# Patient Record
Sex: Male | Born: 2000 | Race: White | Hispanic: No | Marital: Single | Smoking: Never smoker
Health system: Southern US, Community
[De-identification: ages and names within clinical notes are randomized; demographics above are authoritative.]

## PROBLEM LIST (undated history)

## (undated) DIAGNOSIS — S3992XA Unspecified injury of lower back, initial encounter: Secondary | ICD-10-CM

## (undated) DIAGNOSIS — F32A Depression, unspecified: Secondary | ICD-10-CM

---
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
## 2021-09-22 ENCOUNTER — Other Ambulatory Visit (HOSPITAL_COMMUNITY): Payer: Self-pay | Admitting: Pain Medicine

## 2021-09-22 DIAGNOSIS — M546 Pain in thoracic spine: Secondary | ICD-10-CM

## 2021-09-22 DIAGNOSIS — M545 Low back pain, unspecified: Secondary | ICD-10-CM

## 2021-10-07 ENCOUNTER — Encounter (HOSPITAL_COMMUNITY): Payer: Self-pay

## 2021-10-07 ENCOUNTER — Ambulatory Visit (HOSPITAL_COMMUNITY): Payer: Managed Care, Other (non HMO) | Attending: Pain Medicine

## 2021-10-07 ENCOUNTER — Ambulatory Visit (HOSPITAL_COMMUNITY): Admission: RE | Admit: 2021-10-07 | Payer: Managed Care, Other (non HMO) | Source: Ambulatory Visit

## 2021-12-02 ENCOUNTER — Ambulatory Visit (HOSPITAL_COMMUNITY)
Admission: RE | Admit: 2021-12-02 | Discharge: 2021-12-02 | Disposition: A | Discharge status: Home / Self Care | Payer: Managed Care, Other (non HMO) | Source: Ambulatory Visit | Attending: Pain Medicine | Admitting: Pain Medicine

## 2021-12-02 ENCOUNTER — Encounter (HOSPITAL_COMMUNITY): Payer: Self-pay

## 2021-12-02 ENCOUNTER — Other Ambulatory Visit: Payer: Self-pay

## 2021-12-02 ENCOUNTER — Ambulatory Visit (HOSPITAL_COMMUNITY): Admission: RE | Admit: 2021-12-02 | Payer: Managed Care, Other (non HMO) | Source: Ambulatory Visit

## 2021-12-02 DIAGNOSIS — M546 Pain in thoracic spine: Secondary | ICD-10-CM | POA: Insufficient documentation

## 2021-12-02 DIAGNOSIS — M545 Low back pain, unspecified: Secondary | ICD-10-CM | POA: Insufficient documentation

## 2021-12-02 IMAGING — MR MR THORACIC SPINE W/O CM
4 of 11 series · 19 of 48 positions shown · non-contrast
Comparison: Abdominal CT [DATE]

CLINICAL DATA: Constant thoracic pain radiating down the arms and
legs for 4 years.

EXAM:
MRI THORACIC AND LUMBAR SPINE WITHOUT CONTRAST
TECHNIQUE: Multiplanar and multiecho pulse sequences of the thoracic and lumbar
spine were obtained without intravenous contrast.

[Series 3: T2 · sagittal · 3.0mm · 0.66mm/px · 3 of 15 slices shown (1 of 4)]
[im 1/15]
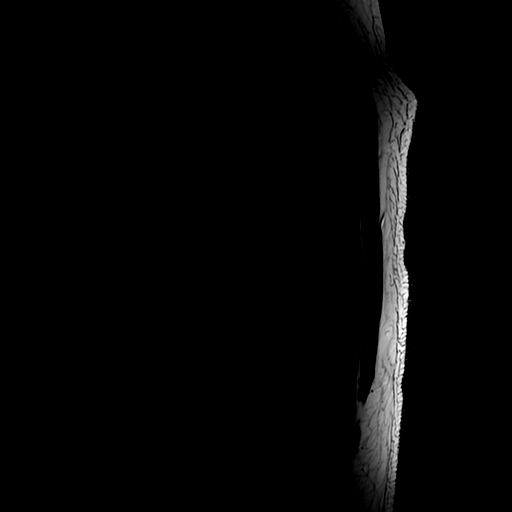
[im 8/15]
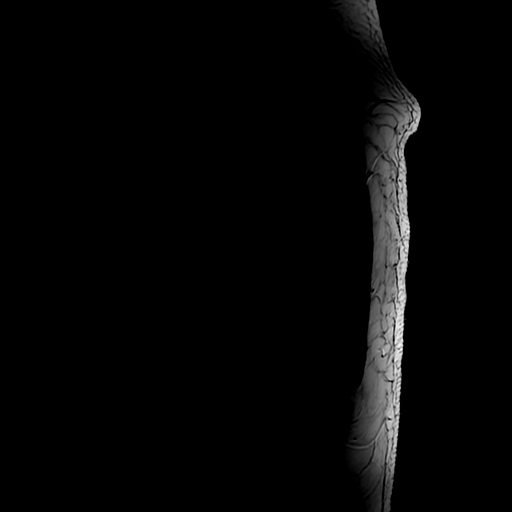
[im 15/15]
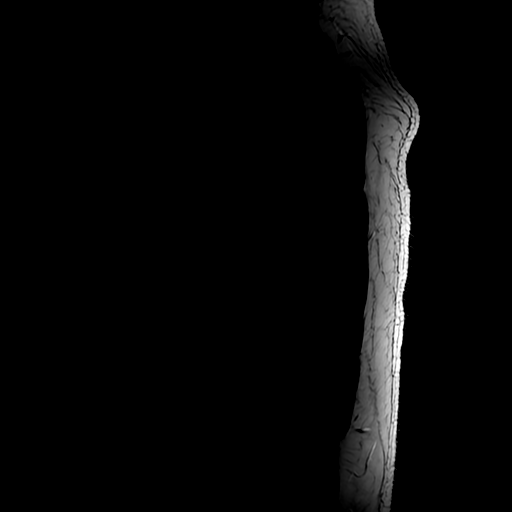

[Series 7: T2 · axial · 4.0mm · 0.39mm/px · z∈[-256,+26]mm · 7 of 39 slices shown (2 of 4)]
[im 1/39]
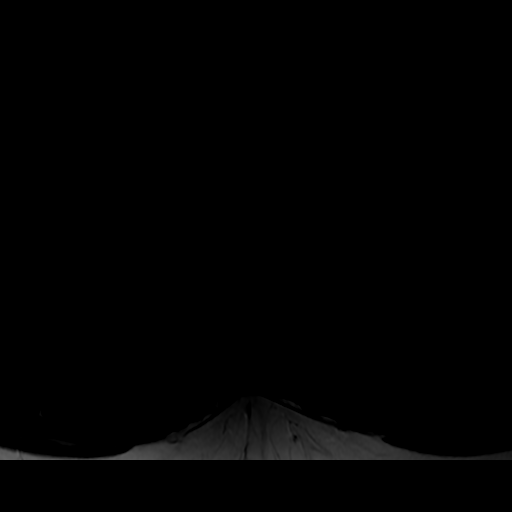
[im 7/39]
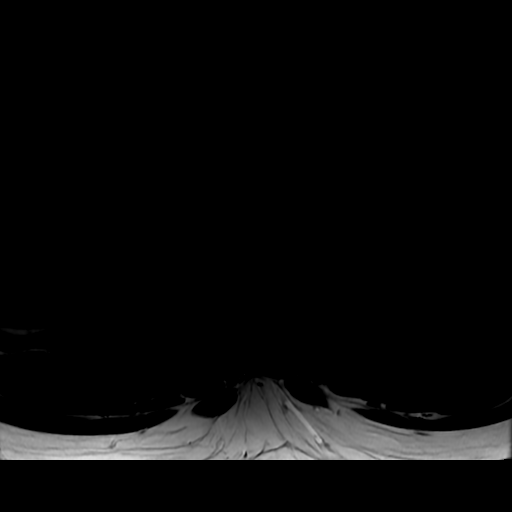
[im 13/39]
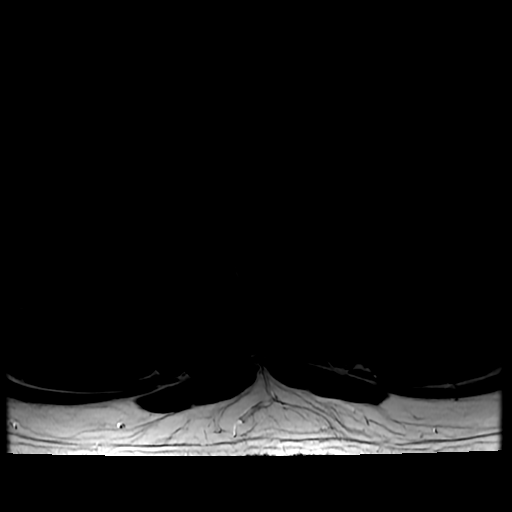
[im 20/39]
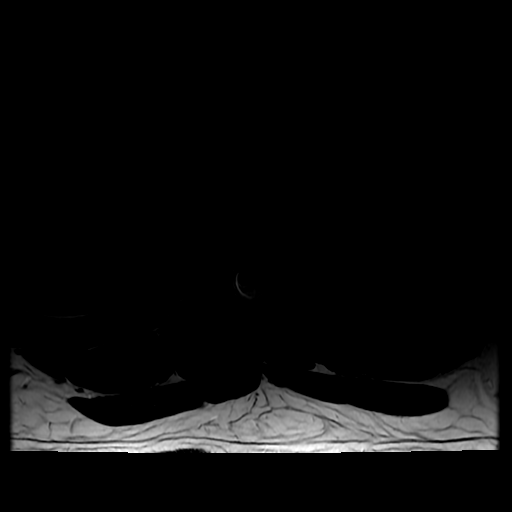
[im 26/39]
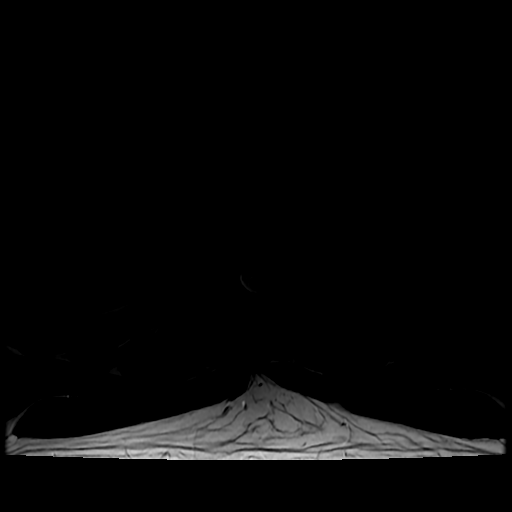
[im 32/39]
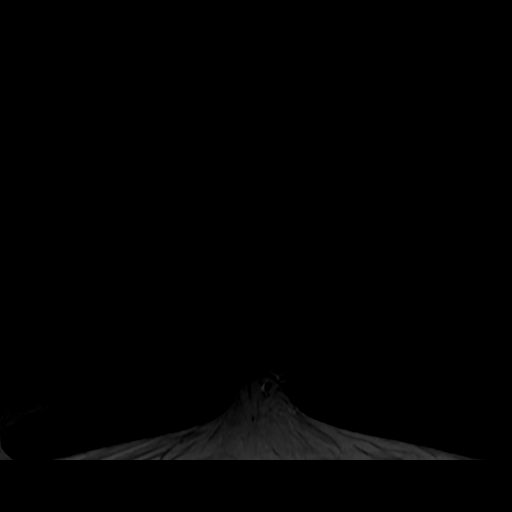
[im 39/39]
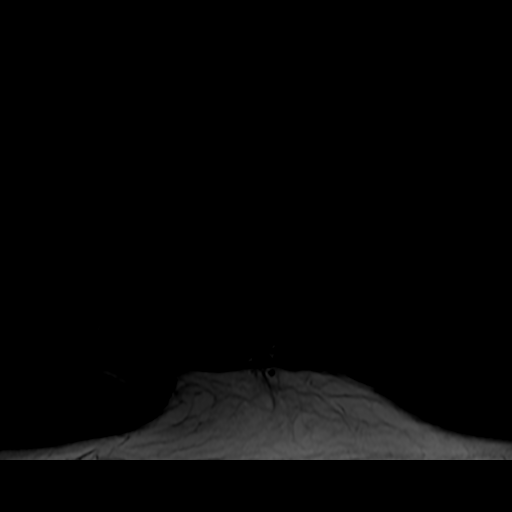

[Series 11: T2 · sagittal · 4.0mm · 0.55mm/px · 3 of 15 slices shown (3 of 4)]
[im 1/15]
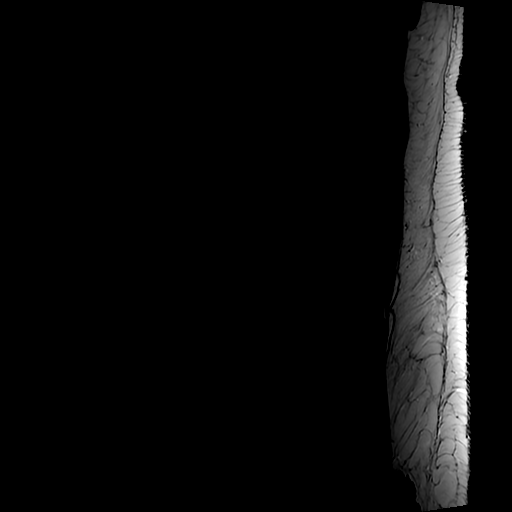
[im 8/15]
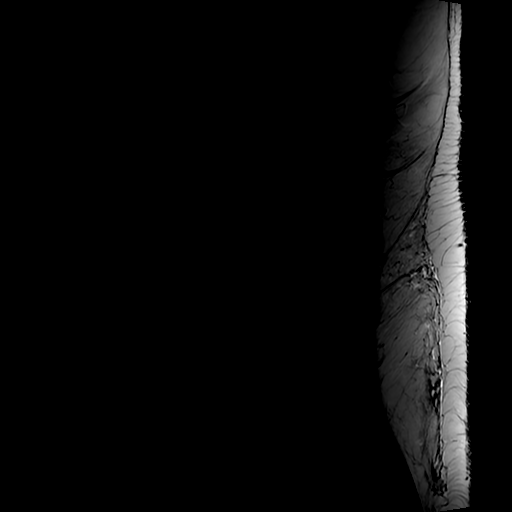
[im 15/15]
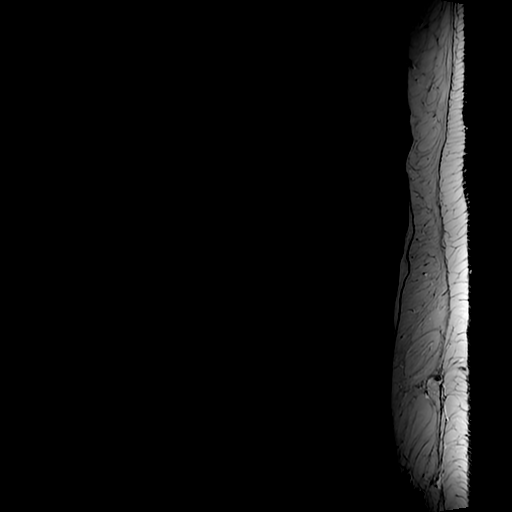

[Series 14: T2 · axial · 4.0mm · 0.39mm/px · z∈[-455,-290]mm · 6 of 36 slices shown (4 of 4)]
[im 1/36]
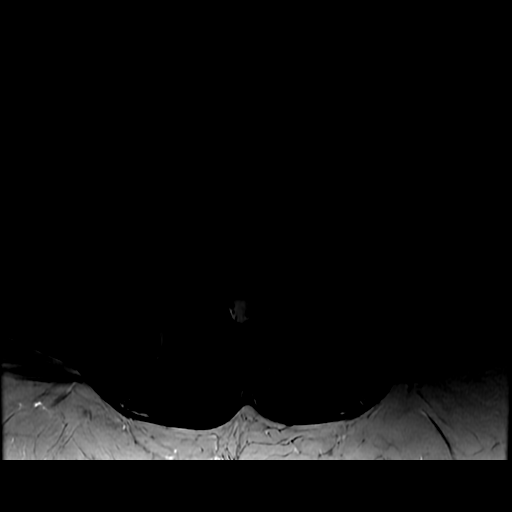
[im 6/36]
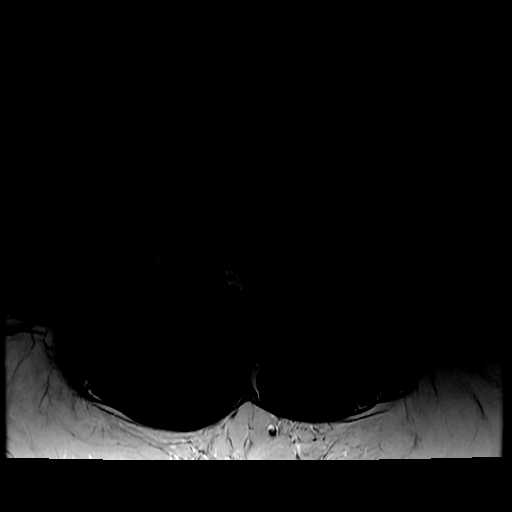
[im 12/36]
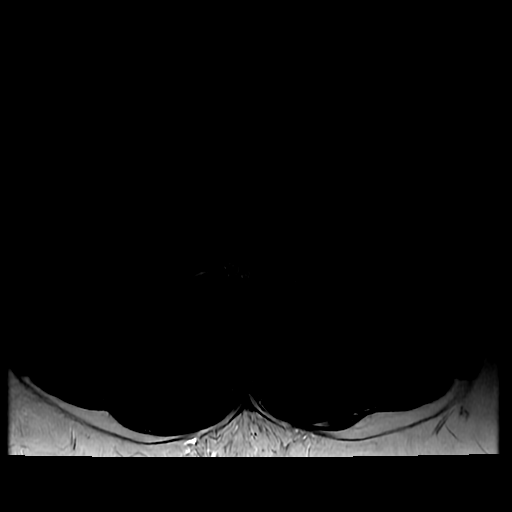
[im 18/36]
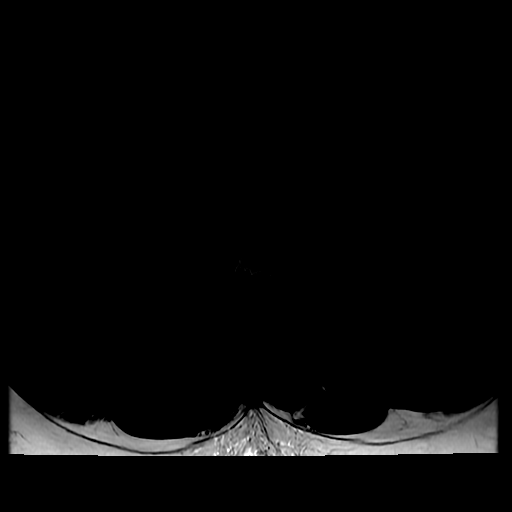
[im 24/36]
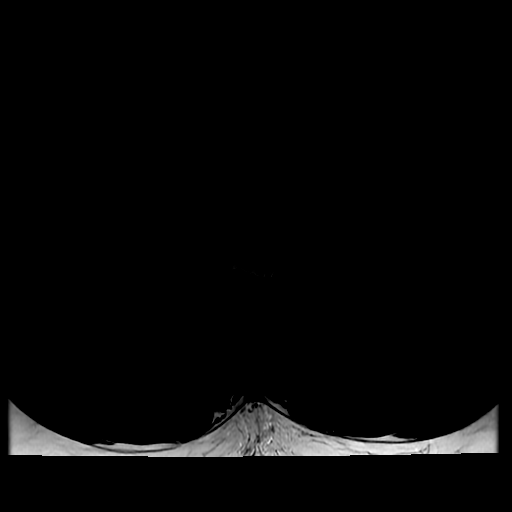
[im 30/36]
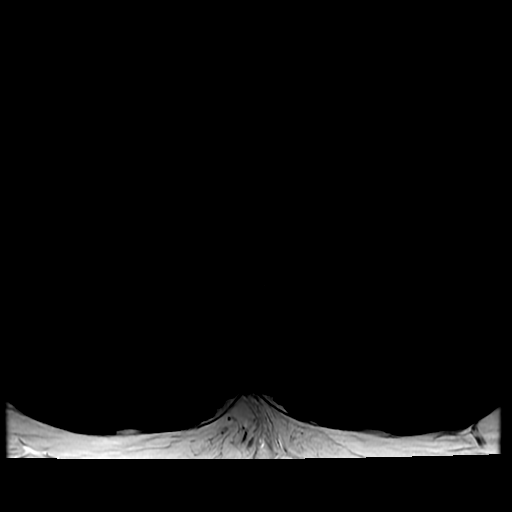

[19 of 48 positions shown; findings below may reference images not displayed]

FINDINGS: MRI THORACIC SPINE FINDINGS

Alignment:  Slight dextrocurvature.  No listhesis.

Vertebrae: No fracture, evidence of discitis, or bone lesion.

Cord: No definite intrathecal collection or cord mass effect
(especially when imaged in the midline). Normal cord signal.

Paraspinal and other soft tissues: Negative

Disc levels:

No herniation or impingement.

MRI LUMBAR SPINE FINDINGS

Segmentation:  5 lumbar type vertebrae

Alignment:  Slight levocurvature.

Vertebrae:  No fracture, evidence of discitis, or bone lesion.

Conus medullaris and cauda equina: Conus extends to the L1 level.
Conus and cauda equina appear normal.

Paraspinal and other soft tissues: Negative

Disc levels:

Well preserved disc height and hydration. No facet spurring or
impingement
IMPRESSION: MR THORACIC SPINE IMPRESSION

No cause for symptoms.  No impingement or inflammation.

MR LUMBAR SPINE IMPRESSION

Mild spinal curvature.  Otherwise unremarkable.

## 2021-12-02 IMAGING — MR MR LUMBAR SPINE W/O CM
4 of 11 series · 19 of 48 positions shown · non-contrast
Comparison: Abdominal CT [DATE]

CLINICAL DATA: Constant thoracic pain radiating down the arms and
legs for 4 years.

EXAM:
MRI THORACIC AND LUMBAR SPINE WITHOUT CONTRAST
TECHNIQUE: Multiplanar and multiecho pulse sequences of the thoracic and lumbar
spine were obtained without intravenous contrast.

[Series 3: T2 · sagittal · 3.0mm · 0.66mm/px · 3 of 15 slices shown (1 of 4)]
[im 1/15]
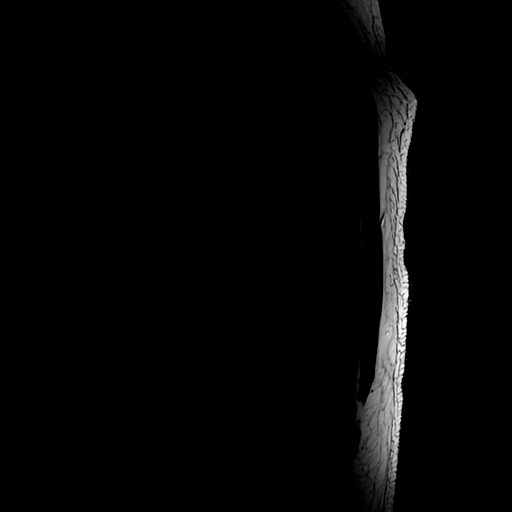
[im 8/15]
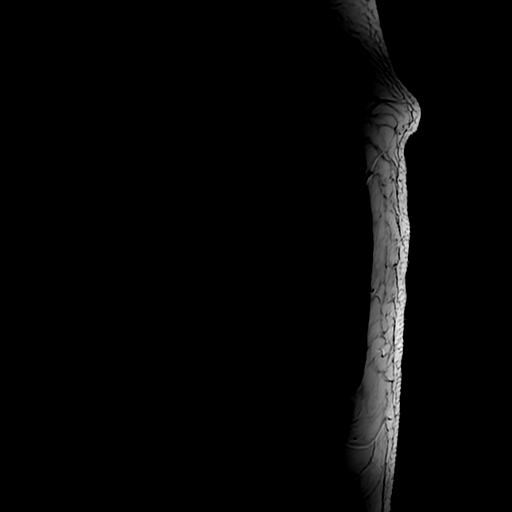
[im 15/15]
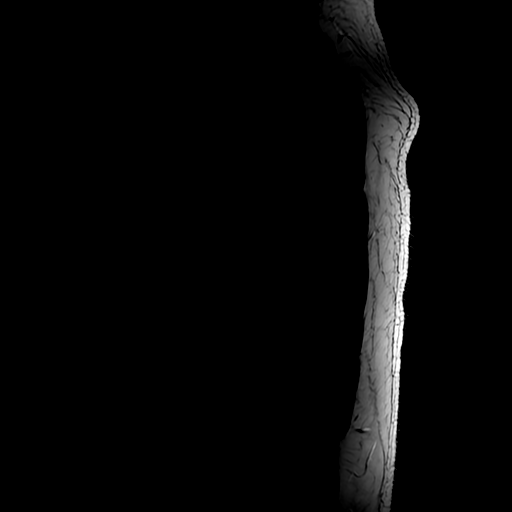

[Series 7: T2 · axial · 4.0mm · 0.39mm/px · z∈[-256,+26]mm · 7 of 39 slices shown (2 of 4)]
[im 1/39]
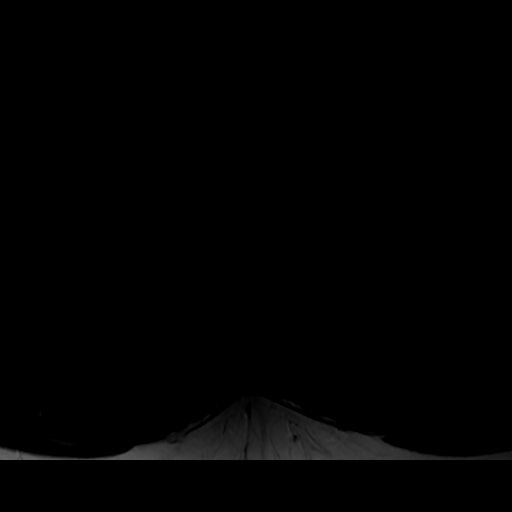
[im 7/39]
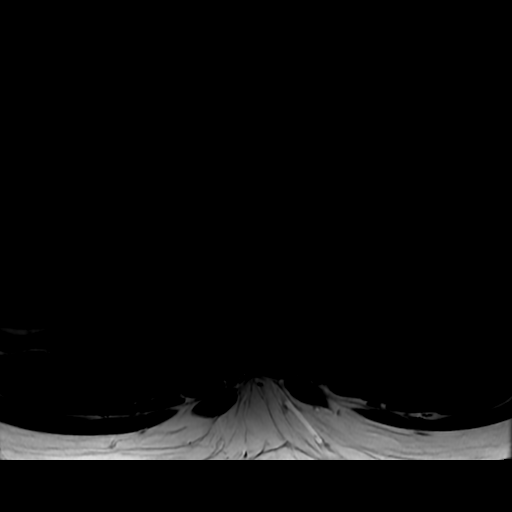
[im 13/39]
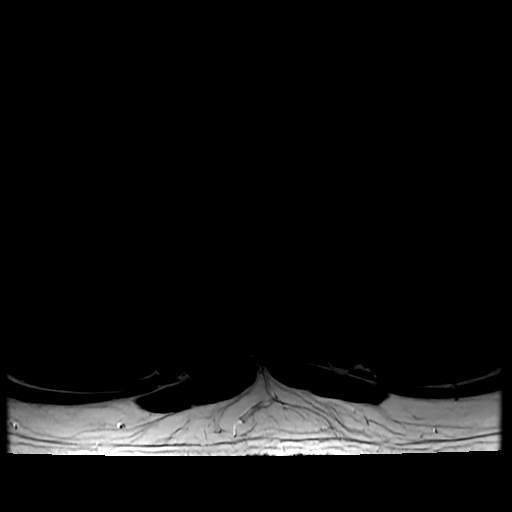
[im 20/39]
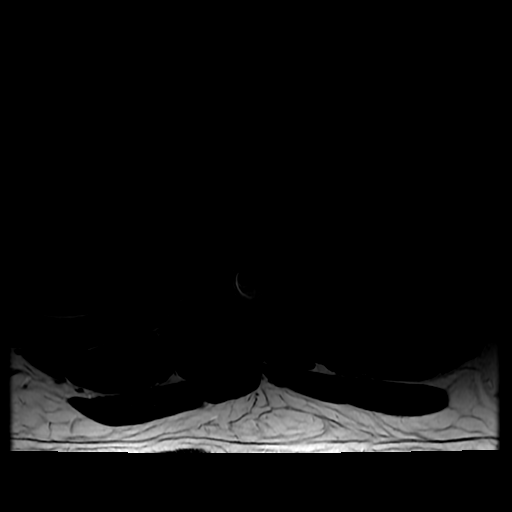
[im 26/39]
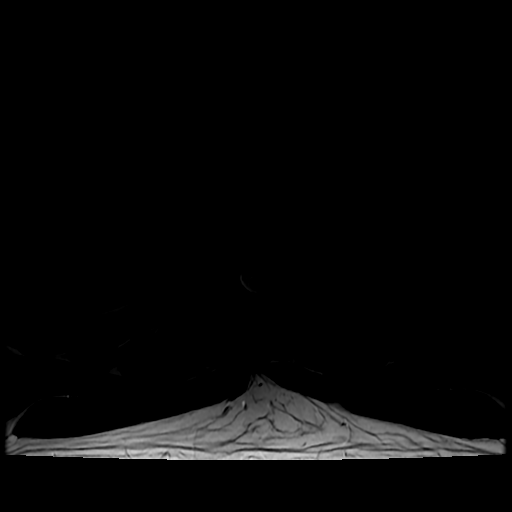
[im 32/39]
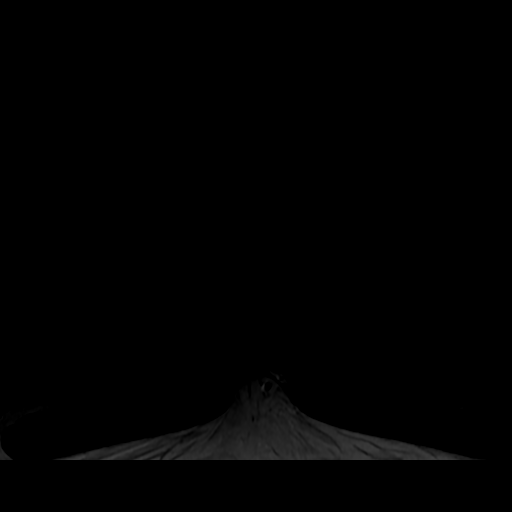
[im 39/39]
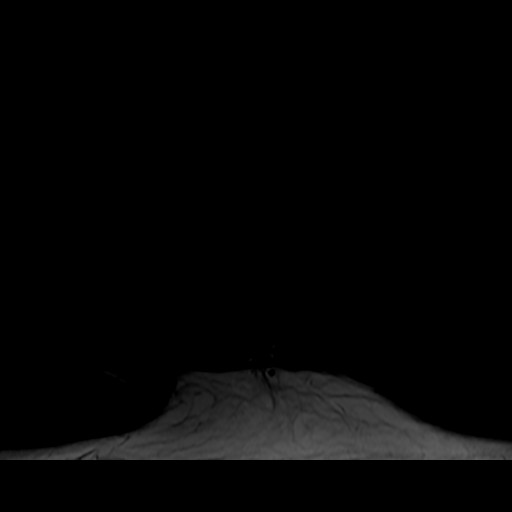

[Series 11: T2 · sagittal · 4.0mm · 0.55mm/px · 3 of 15 slices shown (3 of 4)]
[im 1/15]
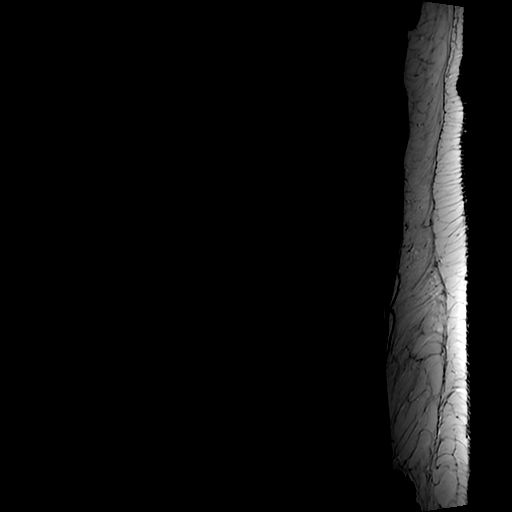
[im 8/15]
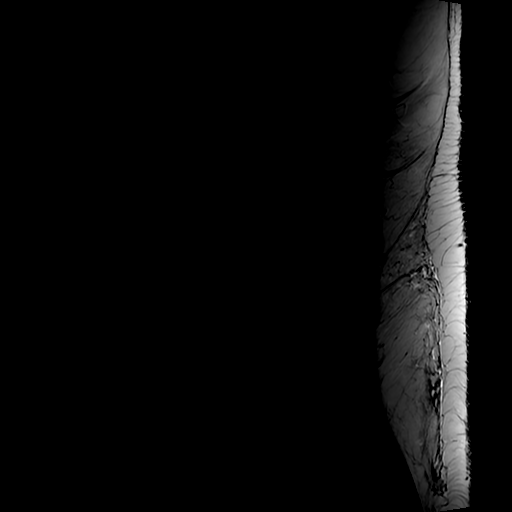
[im 15/15]
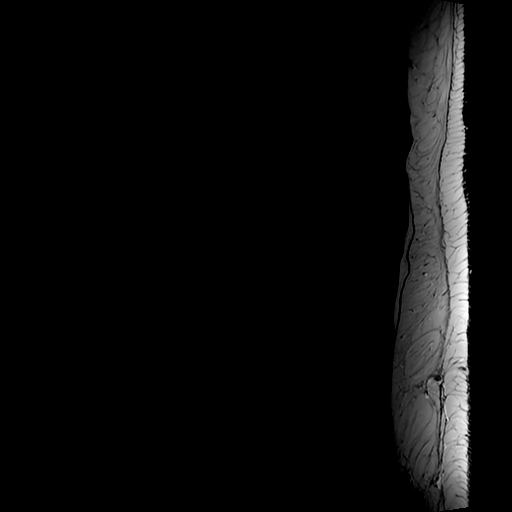

[Series 14: T2 · axial · 4.0mm · 0.39mm/px · z∈[-455,-290]mm · 6 of 36 slices shown (4 of 4)]
[im 1/36]
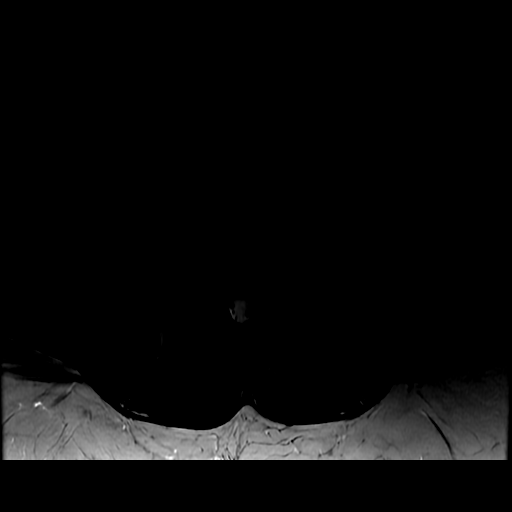
[im 6/36]
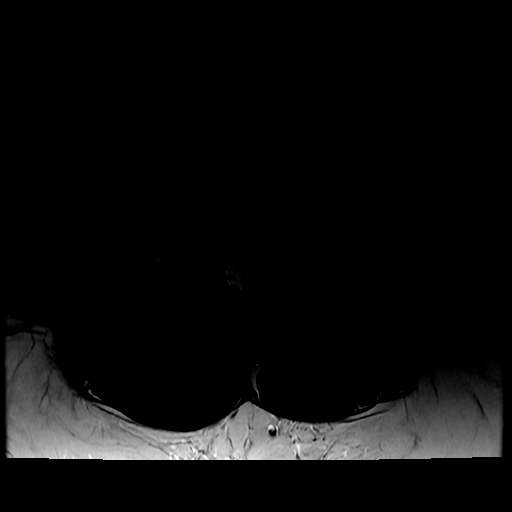
[im 12/36]
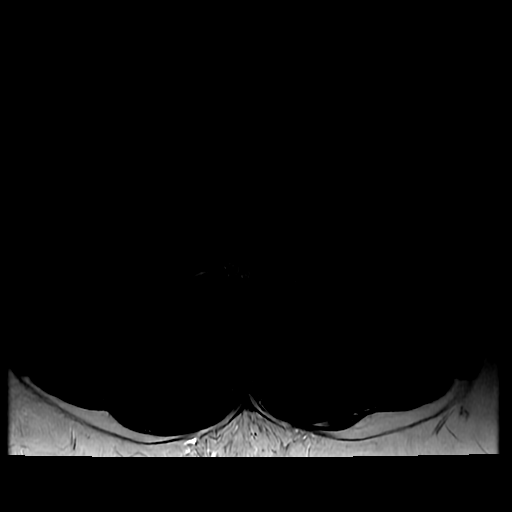
[im 18/36]
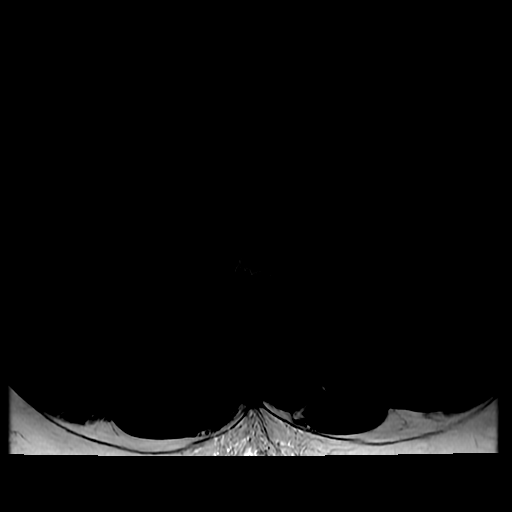
[im 24/36]
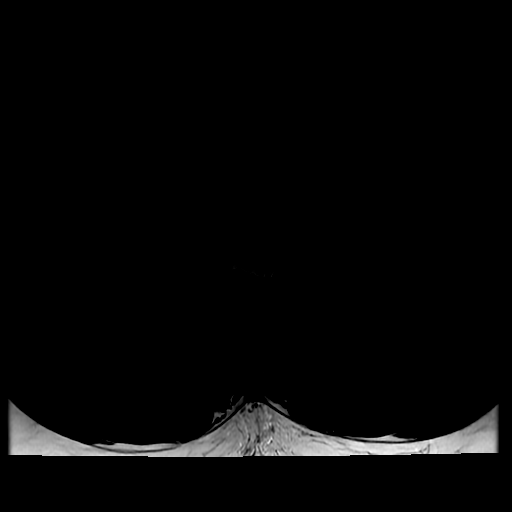
[im 30/36]
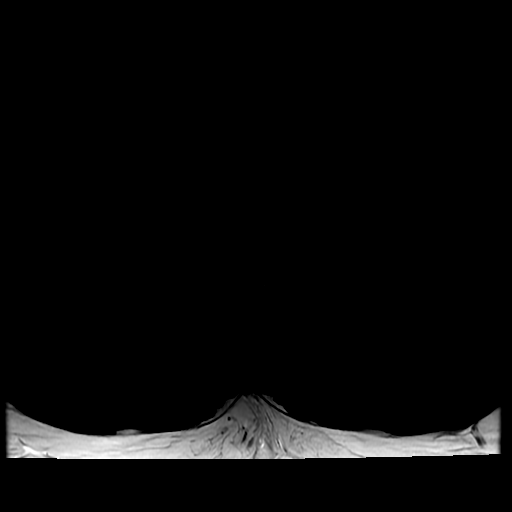

[19 of 48 positions shown; findings below may reference images not displayed]

FINDINGS: MRI THORACIC SPINE FINDINGS

Alignment:  Slight dextrocurvature.  No listhesis.

Vertebrae: No fracture, evidence of discitis, or bone lesion.

Cord: No definite intrathecal collection or cord mass effect
(especially when imaged in the midline). Normal cord signal.

Paraspinal and other soft tissues: Negative

Disc levels:

No herniation or impingement.

MRI LUMBAR SPINE FINDINGS

Segmentation:  5 lumbar type vertebrae

Alignment:  Slight levocurvature.

Vertebrae:  No fracture, evidence of discitis, or bone lesion.

Conus medullaris and cauda equina: Conus extends to the L1 level.
Conus and cauda equina appear normal.

Paraspinal and other soft tissues: Negative

Disc levels:

Well preserved disc height and hydration. No facet spurring or
impingement
IMPRESSION: MR THORACIC SPINE IMPRESSION

No cause for symptoms.  No impingement or inflammation.

MR LUMBAR SPINE IMPRESSION

Mild spinal curvature.  Otherwise unremarkable.

## 2023-12-25 ENCOUNTER — Other Ambulatory Visit: Payer: Self-pay

## 2023-12-25 ENCOUNTER — Encounter (HOSPITAL_BASED_OUTPATIENT_CLINIC_OR_DEPARTMENT_OTHER): Payer: Self-pay | Admitting: Emergency Medicine

## 2023-12-25 ENCOUNTER — Emergency Department (HOSPITAL_BASED_OUTPATIENT_CLINIC_OR_DEPARTMENT_OTHER)
Admission: EM | Admit: 2023-12-25 | Discharge: 2023-12-25 | Discharge status: Against Medical Advice | Payer: BC Managed Care – PPO | Attending: Emergency Medicine | Admitting: Emergency Medicine

## 2023-12-25 DIAGNOSIS — Z5321 Procedure and treatment not carried out due to patient leaving prior to being seen by health care provider: Secondary | ICD-10-CM | POA: Insufficient documentation

## 2023-12-25 DIAGNOSIS — Y92410 Unspecified street and highway as the place of occurrence of the external cause: Secondary | ICD-10-CM | POA: Insufficient documentation

## 2023-12-25 DIAGNOSIS — M545 Low back pain, unspecified: Secondary | ICD-10-CM | POA: Insufficient documentation

## 2023-12-25 HISTORY — DX: Unspecified injury of lower back, initial encounter: S39.92XA

## 2023-12-25 HISTORY — DX: Depression, unspecified: F32.A

## 2023-12-25 NOTE — ED Notes (Signed)
Call for room assignment with no answer x 3
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
## 2023-12-25 NOTE — ED Notes (Signed)
 Called for room assignment with no answer.
